# Patient Record
Sex: Female | Born: 1980 | Race: Black or African American | Hispanic: No | Smoking: Former smoker
Health system: Southern US, Community
[De-identification: ages and names within clinical notes are randomized; demographics above are authoritative.]

## PROBLEM LIST (undated history)

## (undated) DIAGNOSIS — R51 Headache: Secondary | ICD-10-CM

## (undated) DIAGNOSIS — A6000 Herpesviral infection of urogenital system, unspecified: Secondary | ICD-10-CM

## (undated) HISTORY — PX: BUNIONECTOMY: SHX129

---

## 2001-11-01 ENCOUNTER — Other Ambulatory Visit: Admission: RE | Admit: 2001-11-01 | Discharge: 2001-11-01 | Payer: Self-pay | Admitting: *Deleted

## 2002-12-16 ENCOUNTER — Inpatient Hospital Stay (HOSPITAL_COMMUNITY): Admission: AD | Admit: 2002-12-16 | Discharge: 2002-12-22 | Payer: Self-pay | Admitting: Obstetrics

## 2003-07-01 ENCOUNTER — Emergency Department (HOSPITAL_COMMUNITY): Admission: AD | Admit: 2003-07-01 | Discharge: 2003-07-01 | Payer: Self-pay | Admitting: Family Medicine

## 2003-07-01 ENCOUNTER — Ambulatory Visit (HOSPITAL_COMMUNITY): Admission: RE | Admit: 2003-07-01 | Discharge: 2003-07-01 | Payer: Self-pay | Admitting: Family Medicine

## 2004-02-14 ENCOUNTER — Ambulatory Visit (HOSPITAL_COMMUNITY): Admission: RE | Admit: 2004-02-14 | Discharge: 2004-02-14 | Payer: Self-pay | Admitting: Obstetrics

## 2004-09-21 ENCOUNTER — Emergency Department (HOSPITAL_COMMUNITY): Admission: EM | Admit: 2004-09-21 | Discharge: 2004-09-21 | Payer: Self-pay | Admitting: Family Medicine

## 2011-01-11 ENCOUNTER — Inpatient Hospital Stay (HOSPITAL_COMMUNITY): Payer: BC Managed Care – PPO

## 2011-01-11 ENCOUNTER — Inpatient Hospital Stay (HOSPITAL_COMMUNITY)
Admission: RE | Admit: 2011-01-11 | Discharge: 2011-01-11 | Disposition: A | Discharge status: Home / Self Care | Payer: BC Managed Care – PPO | Source: Ambulatory Visit | Attending: Obstetrics and Gynecology | Admitting: Obstetrics and Gynecology

## 2011-01-11 DIAGNOSIS — O368190 Decreased fetal movements, unspecified trimester, not applicable or unspecified: Secondary | ICD-10-CM | POA: Insufficient documentation

## 2011-01-11 LAB — URINALYSIS, ROUTINE W REFLEX MICROSCOPIC
Bilirubin Urine: NEGATIVE
Glucose, UA: NEGATIVE mg/dL
Hgb urine dipstick: NEGATIVE
Ketones, ur: 15 mg/dL — AB
Nitrite: NEGATIVE
Protein, ur: NEGATIVE mg/dL
Specific Gravity, Urine: 1.025 (ref 1.005–1.030)
Urobilinogen, UA: 0.2 mg/dL (ref 0.0–1.0)
pH: 6.5 (ref 5.0–8.0)

## 2011-03-04 ENCOUNTER — Encounter (HOSPITAL_COMMUNITY): Payer: Self-pay

## 2011-03-04 ENCOUNTER — Encounter (HOSPITAL_COMMUNITY)
Admission: RE | Admit: 2011-03-04 | Discharge: 2011-03-04 | Disposition: A | Discharge status: Home / Self Care | Payer: BC Managed Care – PPO | Source: Ambulatory Visit | Attending: Obstetrics and Gynecology | Admitting: Obstetrics and Gynecology

## 2011-03-04 HISTORY — DX: Headache: R51

## 2011-03-04 HISTORY — DX: Herpesviral infection of urogenital system, unspecified: A60.00

## 2011-03-04 LAB — SURGICAL PCR SCREEN
MRSA, PCR: NEGATIVE
Staphylococcus aureus: NEGATIVE

## 2011-03-04 LAB — CBC
HCT: 36.4 % (ref 36.0–46.0)
Hemoglobin: 12 g/dL (ref 12.0–15.0)
MCH: 29.4 pg (ref 26.0–34.0)
MCHC: 33 g/dL (ref 30.0–36.0)
MCV: 89.2 fL (ref 78.0–100.0)
Platelets: 162 10*3/uL (ref 150–400)
RBC: 4.08 MIL/uL (ref 3.87–5.11)
RDW: 15 % (ref 11.5–15.5)
WBC: 7.4 10*3/uL (ref 4.0–10.5)

## 2011-03-04 LAB — RPR: RPR Ser Ql: NONREACTIVE

## 2011-03-04 NOTE — Patient Instructions (Addendum)
18 Gabriela Price  03/04/2011   Your procedure is scheduled on:  03/10/11  Report to [facility] at 11 AM.  Call this number if you have problems the morning of surgery: [phone]   Remember:   Do not eat food:After Midnight.  Do not drink clear liquids: After Midnight.  Take these medicines the morning of surgery with A SIP OF WATER: NA   Do not wear jewelry, make-up or nail polish.  Do not bring valuables to the hospital.  Contacts, dentures or bridgework may not be worn into surgery.  Leave suitcase in the car. After surgery it may be brought to your room.  For patients admitted to the hospital, checkout time is 11:00 AM the day of discharge.   Patients discharged the day of surgery will not be allowed to drive home.  Name and phone number of your driver: NA  Special Instructions: Use CHG wash; 1/2 bottle PM before surgery and 1/2 bottle AM of surgery  Please read over the following fact sheets that you were given: CHG and MRSA +

## 2011-03-09 MED ORDER — CEFOTETAN DISODIUM 2 G IJ SOLR
2.0000 g | INTRAMUSCULAR | Status: AC
  Administered 2011-03-10: 2 g via INTRAVENOUS
  Filled 2011-03-09: qty 2

## 2011-03-10 ENCOUNTER — Encounter (HOSPITAL_COMMUNITY): Payer: Self-pay | Admitting: Anesthesiology

## 2011-03-10 ENCOUNTER — Encounter (HOSPITAL_COMMUNITY)
Admission: RE | Disposition: A | Discharge status: Home / Self Care | Payer: Self-pay | Source: Ambulatory Visit | Attending: Obstetrics and Gynecology

## 2011-03-10 ENCOUNTER — Encounter (HOSPITAL_COMMUNITY): Payer: Self-pay | Admitting: *Deleted

## 2011-03-10 ENCOUNTER — Inpatient Hospital Stay (HOSPITAL_COMMUNITY)
Admission: RE | Admit: 2011-03-10 | Discharge: 2011-03-13 | DRG: 370 | Disposition: A | Discharge status: Home / Self Care | Payer: BC Managed Care – PPO | Source: Ambulatory Visit | Attending: Obstetrics and Gynecology | Admitting: Obstetrics and Gynecology

## 2011-03-10 ENCOUNTER — Inpatient Hospital Stay (HOSPITAL_COMMUNITY): Payer: BC Managed Care – PPO | Admitting: Anesthesiology

## 2011-03-10 DIAGNOSIS — Z3490 Encounter for supervision of normal pregnancy, unspecified, unspecified trimester: Secondary | ICD-10-CM

## 2011-03-10 DIAGNOSIS — D649 Anemia, unspecified: Secondary | ICD-10-CM | POA: Diagnosis not present

## 2011-03-10 DIAGNOSIS — R519 Headache, unspecified: Secondary | ICD-10-CM

## 2011-03-10 DIAGNOSIS — Z01812 Encounter for preprocedural laboratory examination: Secondary | ICD-10-CM

## 2011-03-10 DIAGNOSIS — O34219 Maternal care for unspecified type scar from previous cesarean delivery: Principal | ICD-10-CM | POA: Diagnosis present

## 2011-03-10 DIAGNOSIS — B009 Herpesviral infection, unspecified: Secondary | ICD-10-CM

## 2011-03-10 DIAGNOSIS — O9903 Anemia complicating the puerperium: Secondary | ICD-10-CM | POA: Diagnosis not present

## 2011-03-10 DIAGNOSIS — Z01818 Encounter for other preprocedural examination: Secondary | ICD-10-CM

## 2011-03-10 SURGERY — Surgical Case
Anesthesia: Spinal | Site: Abdomen | Wound class: Clean Contaminated

## 2011-03-10 MED ORDER — OXYTOCIN 20 UNITS IN LACTATED RINGERS INFUSION - SIMPLE
INTRAVENOUS | Status: AC
  Filled 2011-03-10: qty 1000

## 2011-03-10 MED ORDER — PHENYLEPHRINE HCL 10 MG/ML IJ SOLN
INTRAMUSCULAR | Status: DC | PRN
  Administered 2011-03-10: 40 ug via INTRAVENOUS
  Administered 2011-03-10 (×5): 80 ug via INTRAVENOUS
  Administered 2011-03-10: 40 ug via INTRAVENOUS
  Administered 2011-03-10: 80 ug via INTRAVENOUS
  Administered 2011-03-10: 40 ug via INTRAVENOUS
  Administered 2011-03-10: 80 ug via INTRAVENOUS

## 2011-03-10 MED ORDER — SODIUM CHLORIDE 0.9 % IJ SOLN: 3.0000 mL | INTRAMUSCULAR | Status: DC | PRN

## 2011-03-10 MED ORDER — ONDANSETRON HCL 4 MG/2ML IJ SOLN
INTRAMUSCULAR | Status: AC
  Filled 2011-03-10: qty 2

## 2011-03-10 MED ORDER — KETOROLAC TROMETHAMINE 30 MG/ML IJ SOLN: 30.0000 mg | Freq: Four times a day (QID) | INTRAMUSCULAR | Status: AC | PRN

## 2011-03-10 MED ORDER — ZOLPIDEM TARTRATE 5 MG PO TABS: 5.0000 mg | ORAL_TABLET | Freq: Every evening | ORAL | Status: DC | PRN

## 2011-03-10 MED ORDER — METHYLERGONOVINE MALEATE 0.2 MG PO TABS: 0.2000 mg | ORAL_TABLET | ORAL | Status: DC | PRN

## 2011-03-10 MED ORDER — MENTHOL 3 MG MT LOZG: 1.0000 | LOZENGE | OROMUCOSAL | Status: DC | PRN

## 2011-03-10 MED ORDER — MEPERIDINE HCL 25 MG/ML IJ SOLN: 6.2500 mg | INTRAMUSCULAR | Status: DC | PRN

## 2011-03-10 MED ORDER — ONDANSETRON HCL 4 MG/2ML IJ SOLN: 4.0000 mg | INTRAMUSCULAR | Status: DC | PRN

## 2011-03-10 MED ORDER — PRENATAL PLUS 27-1 MG PO TABS: 1.0000 | ORAL_TABLET | Freq: Every day | ORAL | Status: DC

## 2011-03-10 MED ORDER — EPHEDRINE SULFATE 50 MG/ML IJ SOLN
INTRAMUSCULAR | Status: DC | PRN
  Administered 2011-03-10: 5 mg via INTRAVENOUS
  Administered 2011-03-10: 10 mg via INTRAVENOUS

## 2011-03-10 MED ORDER — PHENYLEPHRINE 40 MCG/ML (10ML) SYRINGE FOR IV PUSH (FOR BLOOD PRESSURE SUPPORT)
PREFILLED_SYRINGE | INTRAVENOUS | Status: AC
  Filled 2011-03-10: qty 15

## 2011-03-10 MED ORDER — OXYTOCIN 20 UNITS IN LACTATED RINGERS INFUSION - SIMPLE: INTRAVENOUS | Status: DC | PRN

## 2011-03-10 MED ORDER — LANOLIN HYDROUS EX OINT: 1.0000 "application " | TOPICAL_OINTMENT | CUTANEOUS | Status: DC | PRN

## 2011-03-10 MED ORDER — TETANUS-DIPHTH-ACELL PERTUSSIS 5-2.5-18.5 LF-MCG/0.5 IM SUSP: 0.5000 mL | Freq: Once | INTRAMUSCULAR | Status: DC

## 2011-03-10 MED ORDER — METOCLOPRAMIDE HCL 5 MG/ML IJ SOLN: 10.0000 mg | Freq: Once | INTRAMUSCULAR | Status: DC | PRN

## 2011-03-10 MED ORDER — NALOXONE HCL 0.4 MG/ML IJ SOLN: 0.4000 mg | INTRAMUSCULAR | Status: DC | PRN

## 2011-03-10 MED ORDER — FENTANYL CITRATE 0.05 MG/ML IJ SOLN
25.0000 ug | INTRAMUSCULAR | Status: DC | PRN
  Administered 2011-03-10 (×2): 50 ug via INTRAVENOUS

## 2011-03-10 MED ORDER — SCOPOLAMINE 1 MG/3DAYS TD PT72
1.0000 | MEDICATED_PATCH | Freq: Once | TRANSDERMAL | Status: DC
Start: 2011-03-10 — End: 2011-03-13
  Filled 2011-03-10: qty 1

## 2011-03-10 MED ORDER — PHENYLEPHRINE 40 MCG/ML (10ML) SYRINGE FOR IV PUSH (FOR BLOOD PRESSURE SUPPORT)
PREFILLED_SYRINGE | INTRAVENOUS | Status: AC
  Filled 2011-03-10: qty 5

## 2011-03-10 MED ORDER — OXYTOCIN 10 UNIT/ML IJ SOLN
INTRAMUSCULAR | Status: AC
  Filled 2011-03-10: qty 4

## 2011-03-10 MED ORDER — FENTANYL CITRATE 0.05 MG/ML IJ SOLN
INTRAMUSCULAR | Status: AC
  Filled 2011-03-10: qty 2

## 2011-03-10 MED ORDER — BUPIVACAINE IN DEXTROSE 0.75-8.25 % IT SOLN
INTRATHECAL | Status: DC | PRN
  Administered 2011-03-10: 11 mg via INTRATHECAL

## 2011-03-10 MED ORDER — KETOROLAC TROMETHAMINE 60 MG/2ML IM SOLN
60.0000 mg | Freq: Once | INTRAMUSCULAR | Status: AC | PRN
  Administered 2011-03-10: 60 mg via INTRAMUSCULAR

## 2011-03-10 MED ORDER — METHYLERGONOVINE MALEATE 0.2 MG/ML IJ SOLN: 0.2000 mg | INTRAMUSCULAR | Status: DC | PRN

## 2011-03-10 MED ORDER — MORPHINE SULFATE 0.5 MG/ML IJ SOLN
INTRAMUSCULAR | Status: AC
  Filled 2011-03-10: qty 10

## 2011-03-10 MED ORDER — OXYCODONE-ACETAMINOPHEN 5-325 MG PO TABS
1.0000 | ORAL_TABLET | ORAL | Status: DC | PRN
  Administered 2011-03-11 – 2011-03-12 (×3): 1 via ORAL
  Administered 2011-03-12: 2 via ORAL
  Administered 2011-03-12 – 2011-03-13 (×3): 1 via ORAL
  Filled 2011-03-10: qty 1
  Filled 2011-03-10: qty 2
  Filled 2011-03-10 (×5): qty 1

## 2011-03-10 MED ORDER — FERROUS SULFATE 325 (65 FE) MG PO TABS
325.0000 mg | ORAL_TABLET | Freq: Two times a day (BID) | ORAL | Status: DC
  Administered 2011-03-11 – 2011-03-13 (×5): 325 mg via ORAL
  Filled 2011-03-10 (×5): qty 1

## 2011-03-10 MED ORDER — IBUPROFEN 600 MG PO TABS
600.0000 mg | ORAL_TABLET | Freq: Four times a day (QID) | ORAL | Status: DC
  Administered 2011-03-10 – 2011-03-13 (×11): 600 mg via ORAL
  Filled 2011-03-10 (×2): qty 1

## 2011-03-10 MED ORDER — NALBUPHINE HCL 10 MG/ML IJ SOLN
5.0000 mg | INTRAMUSCULAR | Status: DC | PRN
  Filled 2011-03-10: qty 1

## 2011-03-10 MED ORDER — FENTANYL CITRATE 0.05 MG/ML IJ SOLN
INTRAMUSCULAR | Status: DC | PRN
  Administered 2011-03-10: 25 ug via INTRAVENOUS

## 2011-03-10 MED ORDER — WITCH HAZEL-GLYCERIN EX PADS: MEDICATED_PAD | CUTANEOUS | Status: DC | PRN

## 2011-03-10 MED ORDER — MORPHINE SULFATE (PF) 0.5 MG/ML IJ SOLN
INTRAMUSCULAR | Status: DC | PRN
  Administered 2011-03-10: .1 mg via EPIDURAL

## 2011-03-10 MED ORDER — NALOXONE HCL 0.4 MG/ML IJ SOLN
1.0000 ug/kg/h | INTRAMUSCULAR | Status: DC | PRN
  Filled 2011-03-10: qty 2.5

## 2011-03-10 MED ORDER — SIMETHICONE 80 MG PO CHEW: 80.0000 mg | CHEWABLE_TABLET | ORAL | Status: DC | PRN

## 2011-03-10 MED ORDER — EPHEDRINE 5 MG/ML INJ
INTRAVENOUS | Status: AC
  Filled 2011-03-10: qty 10

## 2011-03-10 MED ORDER — SENNOSIDES-DOCUSATE SODIUM 8.6-50 MG PO TABS
1.0000 | ORAL_TABLET | Freq: Every day | ORAL | Status: DC
  Administered 2011-03-10 – 2011-03-11 (×2): 2 via ORAL
  Administered 2011-03-12: 1 via ORAL

## 2011-03-10 MED ORDER — KETOROLAC TROMETHAMINE 60 MG/2ML IM SOLN
INTRAMUSCULAR | Status: AC
  Filled 2011-03-10: qty 2

## 2011-03-10 MED ORDER — ACETAMINOPHEN 325 MG PO TABS: 325.0000 mg | ORAL_TABLET | ORAL | Status: DC | PRN

## 2011-03-10 MED ORDER — MAGNESIUM HYDROXIDE 400 MG/5ML PO SUSP: 30.0000 mL | ORAL | Status: DC | PRN

## 2011-03-10 MED ORDER — PRENATAL PLUS 27-1 MG PO TABS
1.0000 | ORAL_TABLET | Freq: Every day | ORAL | Status: DC
  Administered 2011-03-11 – 2011-03-13 (×3): 1 via ORAL
  Filled 2011-03-10 (×3): qty 1

## 2011-03-10 MED ORDER — NALBUPHINE HCL 10 MG/ML IJ SOLN
5.0000 mg | INTRAMUSCULAR | Status: DC | PRN
  Administered 2011-03-11: 10 mg via SUBCUTANEOUS
  Filled 2011-03-10: qty 1

## 2011-03-10 MED ORDER — SODIUM CHLORIDE 0.9 % IR SOLN
Status: DC | PRN
  Administered 2011-03-10: 1000 mL
  Administered 2011-03-10: 2000 mL

## 2011-03-10 MED ORDER — DIPHENHYDRAMINE HCL 25 MG PO CAPS
25.0000 mg | ORAL_CAPSULE | Freq: Four times a day (QID) | ORAL | Status: DC | PRN
  Administered 2011-03-11: 25 mg via ORAL
  Filled 2011-03-10: qty 1

## 2011-03-10 MED ORDER — BUTALBITAL-APAP-CAFFEINE 50-325-40 MG PO TABS
1.0000 | ORAL_TABLET | ORAL | Status: DC | PRN
  Filled 2011-03-10: qty 1

## 2011-03-10 MED ORDER — OXYTOCIN 10 UNIT/ML IJ SOLN
20.0000 [IU] | INTRAVENOUS | Status: DC | PRN
  Administered 2011-03-10: 20 mL via INTRAVENOUS

## 2011-03-10 MED ORDER — SCOPOLAMINE 1 MG/3DAYS TD PT72
MEDICATED_PATCH | TRANSDERMAL | Status: AC
  Administered 2011-03-10: 1.5 mg
  Filled 2011-03-10: qty 1

## 2011-03-10 MED ORDER — SIMETHICONE 80 MG PO CHEW
80.0000 mg | CHEWABLE_TABLET | Freq: Three times a day (TID) | ORAL | Status: DC
  Administered 2011-03-10 – 2011-03-13 (×11): 80 mg via ORAL

## 2011-03-10 MED ORDER — ONDANSETRON HCL 4 MG/2ML IJ SOLN
INTRAMUSCULAR | Status: DC | PRN
  Administered 2011-03-10: 4 mg via INTRAVENOUS

## 2011-03-10 MED ORDER — VALACYCLOVIR HCL 500 MG PO TABS
500.0000 mg | ORAL_TABLET | Freq: Two times a day (BID) | ORAL | Status: DC
Start: 2011-03-10 — End: 2011-03-13
  Administered 2011-03-10 – 2011-03-13 (×6): 500 mg via ORAL
  Filled 2011-03-10 (×7): qty 1

## 2011-03-10 MED ORDER — IBUPROFEN 600 MG PO TABS
600.0000 mg | ORAL_TABLET | Freq: Four times a day (QID) | ORAL | Status: DC | PRN
  Filled 2011-03-10 (×9): qty 1

## 2011-03-10 MED ORDER — LACTATED RINGERS IV SOLN
INTRAVENOUS | Status: DC
  Administered 2011-03-10 (×4): via INTRAVENOUS

## 2011-03-10 MED ORDER — OXYTOCIN 20 UNITS IN LACTATED RINGERS INFUSION - SIMPLE
125.0000 mL/h | INTRAVENOUS | Status: AC
  Administered 2011-03-10: 125 mL/h via INTRAVENOUS

## 2011-03-10 MED ORDER — BUPIVACAINE HCL (PF) 0.25 % IJ SOLN
INTRAMUSCULAR | Status: DC | PRN
  Administered 2011-03-10: 20 mL

## 2011-03-10 SURGICAL SUPPLY — 47 items
APL SKNCLS STERI-STRIP NONHPOA (GAUZE/BANDAGES/DRESSINGS)
BENZOIN TINCTURE PRP APPL 2/3 (GAUZE/BANDAGES/DRESSINGS) IMPLANT
BLADE EXTENDED COATED 6.5IN (ELECTRODE) IMPLANT
BLADE HEX COATED 2.75 (ELECTRODE) ×2 IMPLANT
BOOTIES KNEE HIGH SLOAN (MISCELLANEOUS) ×4 IMPLANT
CHLORAPREP W/TINT 26ML (MISCELLANEOUS) ×2 IMPLANT
CLOTH BEACON ORANGE TIMEOUT ST (SAFETY) ×2 IMPLANT
CONTAINER PREFILL 10% NBF 15ML (MISCELLANEOUS) ×4 IMPLANT
DERMABOND ADVANCED (GAUZE/BANDAGES/DRESSINGS) IMPLANT
DRAIN JACKSON PRT FLT 7MM (DRAIN) IMPLANT
DRAPE UTILITY XL STRL (DRAPES) ×2 IMPLANT
DRESSING TELFA 8X3 (GAUZE/BANDAGES/DRESSINGS) IMPLANT
DRSG COVADERM 4X8 (GAUZE/BANDAGES/DRESSINGS) ×2 IMPLANT
ELECT REM PT RETURN 9FT ADLT (ELECTROSURGICAL) ×2
ELECTRODE REM PT RTRN 9FT ADLT (ELECTROSURGICAL) ×1 IMPLANT
EVACUATOR SILICONE 100CC (DRAIN) IMPLANT
EXTRACTOR VACUUM KIWI (MISCELLANEOUS) IMPLANT
EXTRACTOR VACUUM M CUP 4 TUBE (SUCTIONS) IMPLANT
GAUZE SPONGE 4X4 12PLY STRL LF (GAUZE/BANDAGES/DRESSINGS) ×2 IMPLANT
GLOVE ECLIPSE 6.0 STRL STRAW (GLOVE) ×2 IMPLANT
GLOVE INDICATOR 6.5 STRL GRN (GLOVE) ×2 IMPLANT
GLOVE SURG SS PI 6.5 STRL IVOR (GLOVE) ×4 IMPLANT
GLOVE SURG SS PI 7.0 STRL IVOR (GLOVE) ×2 IMPLANT
GOWN BRE IMP SLV AUR LG STRL (GOWN DISPOSABLE) ×4 IMPLANT
GOWN PREVENTION PLUS LG XLONG (DISPOSABLE) ×4 IMPLANT
KIT ABG SYR 3ML LUER SLIP (SYRINGE) IMPLANT
NEEDLE HYPO 25X5/8 SAFETYGLIDE (NEEDLE) ×2 IMPLANT
NEEDLE SPNL 22GX3.5 QUINCKE BK (NEEDLE) ×2 IMPLANT
NS IRRIG 1000ML POUR BTL (IV SOLUTION) ×4 IMPLANT
PACK C SECTION WH (CUSTOM PROCEDURE TRAY) ×2 IMPLANT
PAD ABD 7.5X8 STRL (GAUZE/BANDAGES/DRESSINGS) ×2 IMPLANT
SLEEVE SCD COMPRESS KNEE MED (MISCELLANEOUS) IMPLANT
STRIP CLOSURE SKIN 1/4X4 (GAUZE/BANDAGES/DRESSINGS) IMPLANT
SUT CHROMIC 2 0 SH (SUTURE) ×2 IMPLANT
SUT MNCRL AB 3-0 PS2 27 (SUTURE) ×2 IMPLANT
SUT SILK 0 FSL (SUTURE) IMPLANT
SUT VIC AB 0 CT1 27 (SUTURE) ×2
SUT VIC AB 0 CT1 27XBRD ANBCTR (SUTURE) ×2 IMPLANT
SUT VIC AB 0 CT1 36 (SUTURE) ×6 IMPLANT
SUT VIC AB 2-0 CT1 27 (SUTURE) ×4
SUT VIC AB 2-0 CT1 TAPERPNT 27 (SUTURE) ×2 IMPLANT
SUT VIC AB 2-0 SH 27 (SUTURE)
SUT VIC AB 2-0 SH 27XBRD (SUTURE) IMPLANT
SYR CONTROL 10ML LL (SYRINGE) ×2 IMPLANT
TOWEL OR 17X24 6PK STRL BLUE (TOWEL DISPOSABLE) ×4 IMPLANT
TRAY FOLEY CATH 14FR (SET/KITS/TRAYS/PACK) ×2 IMPLANT
WATER STERILE IRR 1000ML POUR (IV SOLUTION) ×2 IMPLANT

## 2011-03-10 NOTE — H&P (Signed)
Gabriela Price is a 30 y.o. MB female at 39.6 weeks per EDC of 03/11/2011, presenting for scheduled repeat cesarean section. Maternal Medical History:  Reason for admission: Scheduled repeat cesarean section  Contractions: Frequency: rare.   Perceived severity is mild.    Fetal activity: Perceived fetal activity is normal.   Last perceived fetal movement was within the past hour.    Prenatal complications: 1.  Previous c/s 2.  Migraines--rx'd Fioricet 3.  HSV II 4. Third trimester anemai    OB History    Grav Para Term Preterm Abortions TAB SAB Ect Mult Living   3 1   1 1    1      Past Medical History  Diagnosis Date  . Headache   . Herpes genitalia   --remote h/o abnl pap w/ nml colpo --h/o anemia  Past Surgical History  Procedure Date  . Bunionectomy     bilateral  . Cesarean section 2004   Family History: family history is not on file.  PGF: MI & CVA; Mom: HTN; M. Aunt: HTN; MGM & PGM DM; PGM & p. unlce with dementia; dad:  Illicit drug use. P. Uncle: MR.O Social History:  reports that she has quit smoking. She has never used smokeless tobacco. She reports that she does not drink alcohol or use illicit drugs.  Review of Systems  Constitutional: Negative.   HENT: Negative.        Last migraine 2 weeks ago  Respiratory: Negative.   Cardiovascular: Negative.   Gastrointestinal: Negative.        Last BM this AM  Genitourinary: Negative.       Blood pressure 108/78, pulse 101, temperature 98.6 F (37 C), temperature source Oral, resp. rate 18, SpO2 99.00%. Maternal Exam:  Abdomen: Patient reports no abdominal tenderness. Fetal presentation: vertex gravid  Introitus: not evaluated.     Fetal Exam Fetal Monitor Review: Mode: hand-held doppler probe.   See RN documentation for FHT's     Physical Exam  Constitutional: She is oriented to person, place, and time. She appears well-developed and well-nourished.  Cardiovascular: Normal rate and regular  rhythm.   Respiratory: Effort normal and breath sounds normal.  GI: Soft. Bowel sounds are normal.  Musculoskeletal: She exhibits edema.       Moderate generalized edema but nonpitting BLE; no clonus  Neurological: She is alert and oriented to person, place, and time. She has normal reflexes.  Skin: Skin is warm and dry.  Psychiatric: She has a normal mood and affect. Her behavior is normal. Thought content normal.    Prenatal labs: ABO, Rh:  O positive Antibody:  Neg  Rubella:  Immune RPR: NON REACTIVE (07/25 1101)  HBsAg:   Negative HIV:   nonreactive GBS:  neg  Hgb Electrophoresis: Nml 1hr gtt: nml Hgb =10.2 at time of 1hrgtt Assessment/Plan: 1.  IUP at 39.6 2.  Previous c/s with desire for repeat 3.  GBS negative 4.  H/o anemia in pregnancy 5.  Obese 6.  Chronic migraines 7.  H/o hsv II 8.  Patient desires Mirena PP and plans to BF.  1.  Admit to [facility] with Dr. Pennie Rushing as attending MD with routine preoperative and admitting orders 2.  Pt given option of TOLAC vs repeat c/s; R/B/A rev'd, including risks of but not limited to (with repeat c/s) infection, bleeding, damage to surrounding organs, & anesthesia complications.  After discussion, patient continues       To desire proceeding with repeat cesarean  section.   3.  All questions answered and MD to follow.   STEELMAN,CANDICE H 03/10/2011, 11:29 AM

## 2011-03-10 NOTE — Op Note (Signed)
Cesarean Section Procedure Note  Indications: Prior Cesarean section  Pre-operative Diagnosis: 39 week 6 day pregnancy.  Post-operative Diagnosis: same  Surgeon: Hal Morales  First Assistant:Candace Denny Levy  Surgeon: Hal Morales   Anesthesia: Spinal anesthesia  ASA Class: 2  Procedure Details  The patient was seen in the Holding Room. The risks, benefits, complications, treatment options, and expected outcomes were discussed with the patient.  The patient concurred with the proposed plan, giving informed consent.  The site of surgery properly noted/marked. The patient was taken to Operating Room # 9, identified as Gabriela Price and the procedure verified as C-Section Delivery. A Time Out was held and the above information confirmed.  After induction of anesthesia, the patient was  prepped with Chlora Prep in the usual sterile manner.A foley catheter was placed under sterile conditions.  The patient was then draped in the usual fashion.  After assurance of surgical anesthesia,Suprapubicsubcutaneous injection of 0.25% Bupivacaine   A Pfannenstiel incision was made and carried down through the subcutaneous tissue to the fascia. Fascial incision was made and extended transversely. The fascia was separated from the underlying rectus tissue superiorly and inferiorly. The peritoneum was identified and entered. Peritoneal incision was extended longitudinally.  The bladder blade was placed.   A low transverse uterine incision was made two cm above the uterovesical fold, and that incision extended transversely bluntly.The infant was  Delivered with the aid of a Kiwi Vacuum Extractor from OA presentation, and  was a 6lb 11oz femaile with Apgar scores of 6 at one minute and 9 at five minutes.The umbilical cord was clamped and cut.The placenta was removed intact and appeared normal, and was handed off to the employee of [employer] for donation. The uterine outline, tubes  and ovaries appeared normal for the gravid state, except for a 2cm pedunculated right fundal fibroid. The uterine incision was closed with running locked sutures of 0 Vicryl. An imbricating layer of sutures was placed. Hemostasis was observed. Lavage was carried out until clear. The peritoneum was closed with a running suture of 2-0 Vicryl.  The rectus muscles were reapproximated with a figure of 8 suture of 2-0 Vicryl.  The fascia was then reapproximated with a running sutures of 0 Vicryl .Renforcing figure of 8 sutures of 0Vicryl were placed on either side of midline.   The skin was reapproximated with}3-0 moncryl.  A sterile dressing was applied.  Instrument, sponge, and needle counts were correct prior to the abdominal closure and at the conclusion of the case.   Findings:  Placenta contained a 3 vessel cord which was eccentrically inserted    Estimated Blood Loss:          Drains: none             Specimens: placenta to L&D              Complications: ::"None; patient tolerated the procedure well."         Disposition: PACU - hemodynamically stable.         Condition: stable  Attending Attestation: I performed the procedure.  Marga Gramajo P  03/10/2011 2:30 PM

## 2011-03-10 NOTE — Transfer of Care (Signed)
Immediate Anesthesia Transfer of Care Note  Patient: Gabriela Price  Procedure(s) Performed:  CESAREAN SECTION - Repeat   Patient Location: PACU  Anesthesia Type: Spinal  Level of Consciousness: awake, alert  and oriented  Airway & Oxygen Therapy: Patient Spontanous Breathing  Post-op Assessment: Report given to PACU RN and Post -op Vital signs reviewed and stable  Post vital signs: Reviewed and stable  Complications: No apparent anesthesia complications

## 2011-03-10 NOTE — Anesthesia Procedure Notes (Signed)
Spinal Block  Patient location during procedure: OR Staffing Performed by: anesthesiologist  Preanesthetic Checklist Completed: patient identified, site marked, surgical consent, pre-op evaluation, timeout performed, IV checked, risks and benefits discussed and monitors and equipment checked Spinal Block Patient position: sitting Prep: DuraPrep Patient monitoring: heart rate, cardiac monitor, continuous pulse ox and blood pressure Approach: midline Location: L3-4 Injection technique: single-shot Needle Needle type: Sprotte  Needle gauge: 24 G Needle length: 9 cm Assessment Sensory level: T4 Additional Notes Patient identified.  Risk benefits discussed including failed block, incomplete pain control, headache, nerve damage, paralysis, blood pressure changes, nausea, vomiting, reactions to medication both toxic or allergic, and postpartum back pain.  Patient expressed understanding and wished to proceed.  All questions were answered.  Sterile technique used throughout procedure.  CSF was clear.  No parasthesia or other complications.  Please see nursing notes for vital signs.

## 2011-03-10 NOTE — Anesthesia Preprocedure Evaluation (Signed)
Anesthesia Evaluation  Name, MR# and DOB Patient awake  General Assessment Comment  Reviewed: Allergy & Precautions, H&P  and Patient's Chart, lab work & pertinent test results  Airway Mallampati: II TM Distance: >3 FB Neck ROM: full    Dental No notable dental hx    Pulmonary  clear to auscultation  pulmonary exam normal   Cardiovascular Exercise Tolerance: Good regular Normal   Neuro/Psych  GI/Hepatic/Renal   Endo/Other   Abdominal   Musculoskeletal  Hematology   Peds  Reproductive/Obstetrics   Anesthesia Other Findings             Anesthesia Physical Anesthesia Plan  ASA: II  Anesthesia Plan: Spinal   Post-op Pain Management:    Induction:   Airway Management Planned:   Additional Equipment:   Intra-op Plan:   Post-operative Plan:   Informed Consent: I have reviewed the patients History and Physical, chart, labs and discussed the procedure including the risks, benefits and alternatives for the proposed anesthesia with the patient or authorized representative who has indicated his/her understanding and acceptance.   Dental Advisory Given  Plan Discussed with: CRNA  Anesthesia Plan Comments: (Lab work confirmed with CRNA in room. Platelets okay. Discussed spinal anesthetic, and patient consents to the procedure:  included risk of possible headache,backache, failed block, allergic reaction, and nerve injury. This patient was asked if she had any questions or concerns before the procedure started. )        Anesthesia Quick Evaluation  

## 2011-03-10 NOTE — Progress Notes (Signed)
Mom's L side has a crack at base of nipple.  Mom's R side has some mild abrasion at tip of nipple.  Frenulum noted when baby cries (U-shaped tongue), but does not seem to affect tongue mobility.  Decreased suction/vacuum noted on oral exam, but does not seem to prevent baby from transferring milk effectively.  Lurline Hare [place]

## 2011-03-10 NOTE — Anesthesia Postprocedure Evaluation (Signed)
  Anesthesia Post-op Note  Patient: Gabriela Price  Procedure(s) Performed:  CESAREAN SECTION - Repeat  Patient's cardiopulmonary status is stable Patient's level of consciousness: sedate but responsive verbally Pain and nausea are all reasonably controlled No anesthetic complications apparent at this time No follow up care necessary at this time

## 2011-03-11 ENCOUNTER — Inpatient Hospital Stay (HOSPITAL_COMMUNITY)
Admission: RE | Admit: 2011-03-11 | Payer: BC Managed Care – PPO | Source: Ambulatory Visit | Admitting: Obstetrics and Gynecology

## 2011-03-11 ENCOUNTER — Encounter (HOSPITAL_COMMUNITY): Payer: Self-pay | Admitting: Obstetrics and Gynecology

## 2011-03-11 LAB — CBC
Hemoglobin: 9.1 g/dL — ABNORMAL LOW (ref 12.0–15.0)
MCH: 29.3 pg (ref 26.0–34.0)
MCHC: 33.2 g/dL (ref 30.0–36.0)
MCV: 88.1 fL (ref 78.0–100.0)

## 2011-03-11 NOTE — Progress Notes (Signed)
Subjective: Postpartum Day 1: Cesarean Delivery Patient reports tolerating PO, + flatus, + BM and no problems voiding.    Objective: Vital signs in last 24 hours: Temp:  [97.7 F (36.5 C)-99.2 F (37.3 C)] 98.6 F (37 C) (08/01 1022) Pulse Rate:  [75-93] 85  (08/01 1022) Resp:  [13-24] 20  (08/01 1022) BP: (64-119)/(35-86) 98/56 mmHg (08/01 1022) SpO2:  [97 %-100 %] 98 % (08/01 1022) Weight:  [90.266 kg (199 lb)] 199 lb (90.266 kg) (07/31 1711)  Physical Exam:  General: alert Lochia: appropriate Uterine Fundus: firm Incision: healing well, no significant drainage DVT Evaluation: No evidence of DVT seen on physical exam.   Basename 03/11/11 0550  HGB 9.1*  HCT 27.4*    Assessment/Plan: Status post Cesarean section. Doing well postoperatively.  Continue current care.  Rhyann Berton A 03/11/2011, 12:33 PM

## 2011-03-12 NOTE — Progress Notes (Addendum)
Subjective: Postpartum Day 2: Cesarean Delivery Patient reports no problems voiding.  Passing flatus, tolerating a regular diet.  Breastfeeding.  Objective: Vital signs in last 24 hours: Temp:  [97.9 F (36.6 C)-98.3 F (36.8 C)] 98.1 F (36.7 C) (08/02 0639) Pulse Rate:  [76-98] 76  (08/02 0639) Resp:  [18-20] 18  (08/02 0639) BP: (101)/(62-67) 101/67 mmHg (08/02 1610)  Physical Exam:  General: alert Lochia: appropriate Uterine Fundus: firm Incision: healing well DVT Evaluation: No evidence of DVT seen on physical exam.   Basename 03/11/11 0550  HGB 9.1*  HCT 27.4*    Assessment/Plan: Status post Cesarean section. Doing well postoperatively.  Anticipate discharge tomorrow.  LATHAM,VICKI L 03/12/2011, 11:50 AM   Agree with above. Purcell Nails, MD

## 2011-03-12 NOTE — Consult Note (Signed)
Reports BF better.  Appropriate infant output.  Pump paper work given.

## 2011-03-13 ENCOUNTER — Encounter (HOSPITAL_COMMUNITY)
Admission: RE | Admit: 2011-03-13 | Discharge: 2011-03-13 | Disposition: A | Discharge status: Home / Self Care | Payer: BC Managed Care – PPO | Source: Ambulatory Visit | Attending: Obstetrics and Gynecology | Admitting: Obstetrics and Gynecology

## 2011-03-13 DIAGNOSIS — O923 Agalactia: Secondary | ICD-10-CM | POA: Insufficient documentation

## 2011-03-13 MED ORDER — BUTALBITAL-APAP-CAFFEINE 50-325-40 MG PO TABS: 1.0000 | ORAL_TABLET | ORAL | Status: AC | PRN

## 2011-03-13 MED ORDER — OXYCODONE-ACETAMINOPHEN 5-325 MG PO TABS: 1.0000 | ORAL_TABLET | ORAL | Status: AC | PRN

## 2011-03-13 MED ORDER — IBUPROFEN 600 MG PO TABS: 600.0000 mg | ORAL_TABLET | Freq: Four times a day (QID) | ORAL | Status: AC | PRN

## 2011-03-13 MED ORDER — WITCH HAZEL-GLYCERIN EX PADS: MEDICATED_PAD | CUTANEOUS | Status: AC | PRN

## 2011-03-13 MED ORDER — LANOLIN HYDROUS EX OINT: 1.0000 "application " | TOPICAL_OINTMENT | CUTANEOUS | Status: DC | PRN

## 2011-03-13 NOTE — Discharge Summary (Signed)
Obstetric Discharge Summary Reason for Admission: cesarean section Prenatal Procedures: none Intrapartum Procedures: cesarean: low cervical, transverse Postpartum Procedures: none Complications-Operative and Postpartum: anemia  Temp:  [98.2 F (36.8 C)-98.4 F (36.9 C)] 98.4 F (36.9 C) (08/03 0604) Pulse Rate:  [88-98] 88  (08/03 0604) Resp:  [18] 18  (08/03 0604) BP: (105-107)/(70-71) 105/71 mmHg (08/03 0604) Hemoglobin  Date Value Range Status  03/11/2011 9.1* 12.0-15.0 (g/dL) Final     HCT  Date Value Range Status  03/11/2011 27.4* 36.0-46.0 (%) Final  Hospital Course:  The patient underwent a repeat low transverse cesarean section without difficulty.  Post operatively she had rapid return of bowel and bladder function.  She tolerated her post operative anemia without syncope or dizziness. By POD #3 she was eating a regular diet, getting adequate pain relief from her oral medication and was ready for discharge home.  Discharge Diagnoses: Term Pregnancy-delivered and prior c-section, s/p repeat. Chronic anemia  Discharge Information: Date: 03/13/2011 Activity: pelvic rest and Per [facility] discharge booklet Diet: routine Medications:  Medication List  As of 03/13/2011  3:25 PM   START taking these medications         butalbital-acetaminophen-caffeine 50-325-40 MG per tablet   Commonly known as: FIORICET, ESGIC   Take 1 tablet by mouth as needed for headache.      ibuprofen 600 MG tablet   Commonly known as: ADVIL,MOTRIN   Take 1 tablet (600 mg total) by mouth every 6 (six) hours as needed for pain.      lanolin Oint   Apply 1 application topically as needed (for breast care).      oxyCODONE-acetaminophen 5-325 MG per tablet   Commonly known as: PERCOCET   Take 1-2 tablets by mouth every 3 (three) hours as needed (moderate - severe pain).      witch hazel-glycerin pad   Commonly known as: TUCKS   Apply topically as needed (mild hemorrhoid pain).         CONTINUE taking  these medications         IRON PO      prenatal vitamin w/FE, FA 27-1 MG Tabs      valACYclovir 500 MG tablet   Commonly known as: VALTREX          Where to get your medications    These are the prescriptions that you need to pick up. We sent them to a specific pharmacy, so you will need to go there to get them.   [id number] - [place], [place] - [address] AT [address] OF [facility] [address]    [address] [place] [address]    Phone: [phone]        ibuprofen 600 MG tablet   lanolin Oint   witch hazel-glycerin pad         You may get these medications from any pharmacy.         butalbital-acetaminophen-caffeine 50-325-40 MG per tablet         Information on where to get these meds is not yet available. Ask your nurse or doctor.         oxyCODONE-acetaminophen 5-325 MG per tablet           Condition: stable and improved Instructions: refer to practice specific booklet Discharge to: home Follow-up Information    Follow up with [facility] P, MD. (pt has appt)    Contact information:   [address] [place] [address] [phone]  Newborn Data: Live born  Information for the patient's newborn:  Sharpnack, Girl Donyae [[id number]]  female ; APGAR 6,9 ; weight ;6lbs 11oz  Home with mother.  Elesia Pemberton P 03/13/2011, 3:25 PM

## 2011-03-13 NOTE — Progress Notes (Signed)
Subjective: Postpartum Day 3: Cesarean Delivery Patient reports tolerating PO, + flatus and no problems voiding.    Objective: Vital signs in last 24 hours: Temp:  [98.2 F (36.8 C)-98.4 F (36.9 C)] 98.4 F (36.9 C) (08/03 0604) Pulse Rate:  [88-98] 88  (08/03 0604) Resp:  [18] 18  (08/03 0604) BP: (105-107)/(70-71) 105/71 mmHg (08/03 0604)  Physical Exam:  General: alert, cooperative and no distress Lochia: appropriate Uterine Fundus: firm Incision: healing well, no significant drainage, no dehiscence, no significant erythema DVT Evaluation: No evidence of DVT seen on physical exam.   Basename 03/11/11 0550  HGB 9.1*  HCT 27.4*    Assessment/Plan: Status post Cesarean section. Doing well postoperatively.  Discharge home with standard precautions and return to clinic in 4-6 weeks.  Tayen Narang P 03/13/2011, 3:37 PM

## 2011-04-14 ENCOUNTER — Encounter (HOSPITAL_COMMUNITY)
Admission: RE | Admit: 2011-04-14 | Discharge: 2011-04-14 | Disposition: A | Discharge status: Home / Self Care | Payer: BC Managed Care – PPO | Source: Ambulatory Visit | Attending: Obstetrics and Gynecology | Admitting: Obstetrics and Gynecology

## 2011-04-14 DIAGNOSIS — O923 Agalactia: Secondary | ICD-10-CM | POA: Insufficient documentation

## 2011-05-14 ENCOUNTER — Encounter (HOSPITAL_COMMUNITY)
Admission: RE | Admit: 2011-05-14 | Discharge: 2011-05-14 | Disposition: A | Discharge status: Home / Self Care | Payer: BC Managed Care – PPO | Source: Ambulatory Visit | Attending: Obstetrics and Gynecology | Admitting: Obstetrics and Gynecology

## 2011-05-14 DIAGNOSIS — O923 Agalactia: Secondary | ICD-10-CM | POA: Insufficient documentation

## 2011-05-26 ENCOUNTER — Encounter (HOSPITAL_COMMUNITY): Payer: Self-pay | Admitting: *Deleted

## 2011-06-14 ENCOUNTER — Encounter (HOSPITAL_COMMUNITY)
Admission: RE | Admit: 2011-06-14 | Discharge: 2011-06-14 | Disposition: A | Discharge status: Home / Self Care | Payer: BC Managed Care – PPO | Source: Ambulatory Visit | Attending: Obstetrics and Gynecology | Admitting: Obstetrics and Gynecology

## 2011-06-14 DIAGNOSIS — O923 Agalactia: Secondary | ICD-10-CM | POA: Insufficient documentation

## 2011-07-15 ENCOUNTER — Encounter (HOSPITAL_COMMUNITY)
Admission: RE | Admit: 2011-07-15 | Discharge: 2011-07-15 | Disposition: A | Discharge status: Home / Self Care | Payer: BC Managed Care – PPO | Source: Ambulatory Visit | Attending: Obstetrics and Gynecology | Admitting: Obstetrics and Gynecology

## 2011-07-15 DIAGNOSIS — Z01419 Encounter for gynecological examination (general) (routine) without abnormal findings: Secondary | ICD-10-CM | POA: Insufficient documentation

## 2011-07-15 DIAGNOSIS — M542 Cervicalgia: Secondary | ICD-10-CM | POA: Insufficient documentation

## 2012-07-31 ENCOUNTER — Other Ambulatory Visit: Payer: Self-pay | Admitting: Obstetrics and Gynecology

## 2012-08-01 ENCOUNTER — Telehealth: Payer: Self-pay | Admitting: Obstetrics and Gynecology

## 2012-08-01 NOTE — Telephone Encounter (Signed)
Tc to pt. Pt aware rx for Valacyclovir e-pres to pharm on file.

## 2012-08-09 ENCOUNTER — Ambulatory Visit (INDEPENDENT_AMBULATORY_CARE_PROVIDER_SITE_OTHER): Payer: BC Managed Care – PPO | Admitting: Obstetrics and Gynecology

## 2012-08-09 ENCOUNTER — Encounter: Payer: Self-pay | Admitting: Obstetrics and Gynecology

## 2012-08-09 VITALS — BP 100/60 | Ht 62.0 in | Wt 173.0 lb

## 2012-08-09 DIAGNOSIS — Z124 Encounter for screening for malignant neoplasm of cervix: Secondary | ICD-10-CM

## 2012-08-09 DIAGNOSIS — B009 Herpesviral infection, unspecified: Secondary | ICD-10-CM

## 2012-08-09 MED ORDER — ACYCLOVIR 5 % EX OINT: TOPICAL_OINTMENT | CUTANEOUS | Status: AC

## 2012-08-09 MED ORDER — VALACYCLOVIR HCL 500 MG PO TABS: 500.0000 mg | ORAL_TABLET | Freq: Every day | ORAL | Status: DC

## 2012-08-09 NOTE — Progress Notes (Signed)
Subjective:    Gabriela Price is a 31 y.o. female, [id number], who presents for an annual exam.     History   Social History  . Marital Status: Married    Spouse Name: N/A    Number of Children: N/A  . Years of Education: N/A   Social History Main Topics  . Smoking status: Former Games developer  . Smokeless tobacco: Never Used  . Alcohol Use: No  . Drug Use: No  . Sexually Active: Yes     Comment: Husband Vasectomy    Other Topics Concern  . None   Social History Narrative  . None    Menstrual cycle:   LMP: Patient's last menstrual period was 07/25/2012.           Cycle: usually monthly for 5 days.  This month had 3 days spotting starting 2 days after the end of menses  The following portions of the patient's history were reviewed and updated as appropriate: allergies, current medications, past family history, past medical history, past social history, past surgical history and problem list.  Review of Systems Pertinent items are noted in HPI. Breast:Negative for breast lump,nipple discharge or nipple retraction Gastrointestinal: Negative for abdominal pain, change in bowel habits or rectal bleeding Urinary:negative   Objective:    BP 100/60  Ht 5\' 2"  (1.575 m)  Wt 173 lb (78.472 kg)  BMI 31.64 kg/m2  LMP 07/25/2012  Breastfeeding? No    Weight:  Wt Readings from Last 1 Encounters:  08/09/12 173 lb (78.472 kg)          BMI: Body mass index is 31.64 kg/(m^2).  General Appearance: Alert, appropriate appearance for age. No acute distress HEENT: Grossly normal Neck / Thyroid: Supple, no masses, nodes or enlargement Lungs: clear to auscultation bilaterally Back: No CVA tenderness Breast Exam: No masses or nodes.No dimpling, nipple retraction or discharge. Cardiovascular: Regular rate and rhythm. S1, S2, no murmur Gastrointestinal: Soft, non-tender, no masses or organomegaly Pelvic Exam: Vulva and vagina appear normal. Bimanual exam reveals normal uterus and  adnexa. Rectovaginal: normal rectal, no masses Lymphatic Exam: Non-palpable nodes in neck, clavicular, axillary, or inguinal regions Skin: no rash or abnormalities Neurologic: Normal gait and speech, no tremor  Psychiatric: Alert and oriented, appropriate affect.   Wet Prep:not applicable Urinalysis:not applicable UPT: Not done   Assessment:    Normal gyn exam Single episode post menstrual spotting    Plan:    pap smear with HR HPV. No hx abnl return annually or prn STD screening: declined Contraception:vasectomy   Dierdre Forth  MD  Last Pap: 04/20/2011 WNL: Yes Regular Periods:yes spotting the past few days  Contraception: Husband Vasectomy  Monthly Breast exam:no Tetanus<73yrs:yes Nl.Bladder Function:yes Daily BMs:yes Healthy Diet:yes Calcium:no Mammogram:no Date of Mammogram: n/a  Exercise:yes Have often Exercise: thre times weekly  Seatbelt: yes Abuse at home: no Stressful work:yes Sigmoid-colonoscopy: Pts mother had cancerous polyps  Bone Density: No PCP: n/a Change in PMH: 4 month old baby girl Change in AVW:UJWJ   Subjective:    Gabriela Price is a 31 y.o. female, [id number], who presents for an annual exam.     History   Social History  . Marital Status: Married    Spouse Name: N/A    Number of Children: N/A  . Years of Education: N/A   Social History Main Topics  . Smoking status: Former Games developer  . Smokeless tobacco: Never Used  . Alcohol Use: No  . Drug Use: No  .  Sexually Active: Yes     Comment: Husband Vasectomy    Other Topics Concern  . None   Social History Narrative  . None    Menstrual cycle:   LMP: Patient's last menstrual period was 07/25/2012.           Cycle: reg monthly, usually 5 days, no IM bleeding.  Cramps relieved by OTC meds  The following portions of the patient's history were reviewed and updated as appropriate: allergies, current medications, past family history, past medical history, past social history,  past surgical history and problem list.  Review of Systems Pertinent items are noted in HPI. Breast:Negative for breast lump,nipple discharge or nipple retraction Gastrointestinal: Negative for abdominal pain, change in bowel habits or rectal bleeding Urinary:negative   Objective:    BP 100/60  Ht 5\' 2"  (1.575 m)  Wt 173 lb (78.472 kg)  BMI 31.64 kg/m2  LMP 07/25/2012  Breastfeeding? No    Weight:  Wt Readings from Last 1 Encounters:  08/09/12 173 lb (78.472 kg)          BMI: Body mass index is 31.64 kg/(m^2).  General Appearance: Alert, appropriate appearance for age. No acute distress HEENT: Grossly normal Neck / Thyroid: Supple, no masses, nodes or enlargement Lungs: clear to auscultation bilaterally Back: No CVA tenderness Breast Exam: No masses or nodes.No dimpling, nipple retraction or discharge. Cardiovascular: Regular rate and rhythm. S1, S2, no murmur Gastrointestinal: Soft, non-tender, no masses or organomegaly Pelvic Exam: Vulva and vagina appear normal. Bimanual exam reveals normal uterus and adnexa. Rectovaginal: normal rectal, no masses Lymphatic Exam: Non-palpable nodes in neck, clavicular, axillary, or inguinal regions Skin: no rash or abnormalities Neurologic: Normal gait and speech, no tremor  Psychiatric: Alert and oriented, appropriate affect.   Wet Prep:not applicable Urinalysis:not applicable UPT: Not done   Assessment:    Normal gyn exam HSV II with occassional outbreaks    Plan:    pap smear with HR HPV return annually or prn STD screening: declined Contraception:vasectomy Valtrex and zovirax ointment  Delaine Lame

## 2012-08-11 LAB — PAP IG AND HPV HIGH-RISK: HPV DNA High Risk: NOT DETECTED

## 2012-09-29 ENCOUNTER — Other Ambulatory Visit: Payer: Self-pay | Admitting: Obstetrics and Gynecology

## 2013-08-10 DIAGNOSIS — F5089 Other specified eating disorder: Secondary | ICD-10-CM | POA: Insufficient documentation

## 2014-06-11 ENCOUNTER — Encounter: Payer: Self-pay | Admitting: Obstetrics and Gynecology

## 2014-08-10 HISTORY — PX: OTHER SURGICAL HISTORY: SHX169

## 2017-03-08 ENCOUNTER — Emergency Department (HOSPITAL_COMMUNITY): Payer: BC Managed Care – PPO

## 2017-03-08 ENCOUNTER — Emergency Department (HOSPITAL_COMMUNITY)
Admission: EM | Admit: 2017-03-08 | Discharge: 2017-03-08 | Disposition: A | Discharge status: Home / Self Care | Payer: BC Managed Care – PPO | Attending: Emergency Medicine | Admitting: Emergency Medicine

## 2017-03-08 DIAGNOSIS — S161XXA Strain of muscle, fascia and tendon at neck level, initial encounter: Secondary | ICD-10-CM | POA: Diagnosis not present

## 2017-03-08 DIAGNOSIS — Y92410 Unspecified street and highway as the place of occurrence of the external cause: Secondary | ICD-10-CM | POA: Insufficient documentation

## 2017-03-08 DIAGNOSIS — Y939 Activity, unspecified: Secondary | ICD-10-CM | POA: Insufficient documentation

## 2017-03-08 DIAGNOSIS — Y999 Unspecified external cause status: Secondary | ICD-10-CM | POA: Insufficient documentation

## 2017-03-08 DIAGNOSIS — R110 Nausea: Secondary | ICD-10-CM | POA: Diagnosis not present

## 2017-03-08 DIAGNOSIS — S1980XA Other specified injuries of unspecified part of neck, initial encounter: Secondary | ICD-10-CM | POA: Diagnosis present

## 2017-03-08 DIAGNOSIS — R51 Headache: Secondary | ICD-10-CM | POA: Insufficient documentation

## 2017-03-08 MED ORDER — CYCLOBENZAPRINE HCL 10 MG PO TABS
10.0000 mg | ORAL_TABLET | Freq: Once | ORAL | Status: AC
  Administered 2017-03-08: 10 mg via ORAL
  Filled 2017-03-08: qty 1

## 2017-03-08 MED ORDER — IBUPROFEN 400 MG PO TABS
600.0000 mg | ORAL_TABLET | Freq: Once | ORAL | Status: AC
  Administered 2017-03-08: 600 mg via ORAL
  Filled 2017-03-08: qty 1

## 2017-03-08 MED ORDER — CYCLOBENZAPRINE HCL 10 MG PO TABS: 10.0000 mg | ORAL_TABLET | Freq: Two times a day (BID) | ORAL | 0 refills | Status: DC | PRN

## 2017-03-08 MED ORDER — DICLOFENAC SODIUM 50 MG PO TBEC: 50.0000 mg | DELAYED_RELEASE_TABLET | Freq: Two times a day (BID) | ORAL | 0 refills | Status: DC
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
## 2017-03-08 NOTE — ED Notes (Signed)
Patient is A&Ox4.  No signs of distress noted.  Please see providers complete history and physical exam.  

## 2017-08-24 DIAGNOSIS — H9120 Sudden idiopathic hearing loss, unspecified ear: Secondary | ICD-10-CM | POA: Insufficient documentation

## 2017-08-24 DIAGNOSIS — H9311 Tinnitus, right ear: Secondary | ICD-10-CM | POA: Insufficient documentation

## 2017-08-27 ENCOUNTER — Other Ambulatory Visit: Payer: Self-pay | Admitting: Physician Assistant

## 2017-08-27 DIAGNOSIS — H9120 Sudden idiopathic hearing loss, unspecified ear: Secondary | ICD-10-CM

## 2017-08-27 DIAGNOSIS — H9311 Tinnitus, right ear: Secondary | ICD-10-CM

## 2017-09-01 ENCOUNTER — Ambulatory Visit
Admission: RE | Admit: 2017-09-01 | Discharge: 2017-09-01 | Disposition: A | Discharge status: Home / Self Care | Payer: BC Managed Care – PPO | Source: Ambulatory Visit | Attending: Physician Assistant | Admitting: Physician Assistant

## 2017-09-01 DIAGNOSIS — H9311 Tinnitus, right ear: Secondary | ICD-10-CM

## 2017-09-01 DIAGNOSIS — H9120 Sudden idiopathic hearing loss, unspecified ear: Secondary | ICD-10-CM

## 2017-09-01 MED ORDER — GADOBENATE DIMEGLUMINE 529 MG/ML IV SOLN
17.0000 mL | Freq: Once | INTRAVENOUS | Status: AC | PRN
  Administered 2017-09-01: 17 mL via INTRAVENOUS

## 2018-08-09 ENCOUNTER — Ambulatory Visit (HOSPITAL_COMMUNITY)
Admission: EM | Admit: 2018-08-09 | Discharge: 2018-08-09 | Disposition: A | Discharge status: Home / Self Care | Payer: BC Managed Care – PPO | Attending: Family Medicine | Admitting: Family Medicine

## 2018-08-09 ENCOUNTER — Encounter (HOSPITAL_COMMUNITY): Payer: Self-pay | Admitting: Emergency Medicine

## 2018-08-09 ENCOUNTER — Ambulatory Visit (INDEPENDENT_AMBULATORY_CARE_PROVIDER_SITE_OTHER): Payer: BC Managed Care – PPO

## 2018-08-09 DIAGNOSIS — M79672 Pain in left foot: Secondary | ICD-10-CM | POA: Insufficient documentation

## 2018-08-09 DIAGNOSIS — S9030XA Contusion of unspecified foot, initial encounter: Secondary | ICD-10-CM | POA: Insufficient documentation

## 2018-08-09 MED ORDER — NAPROXEN 500 MG PO TABS: 500.0000 mg | ORAL_TABLET | Freq: Two times a day (BID) | ORAL | 0 refills | Status: DC

## 2018-08-09 NOTE — ED Provider Notes (Signed)
[facility]   [id number] 08/09/18 Arrival Time: 1404  CC: Left foot pain  SUBJECTIVE: History from: patient. Merleen NicelyJawana S Amero is a 37 y.o. female complains of left foot pain that began earlier today.  Symptoms began after she dropped a 50-60lbs object on her foot.  Localizes the pain to the top of her foot.  Has tried OTC medications like motrin with minimal relief.  Symptoms are made worse with weight-bearing.  Denies similar symptoms in the past.  Complains of associated swelling, and ecchymosis.  Denies fever, chills, erythema, weakness, numbness and tingling.      ROS: As per HPI.  Past Medical History:  Diagnosis Date  . Headache(784.0)   . Herpes genitalia    Past Surgical History:  Procedure Laterality Date  . BUNIONECTOMY     bilateral  . CESAREAN SECTION  2004  . CESAREAN SECTION  03/10/2011   Procedure: CESAREAN SECTION;  Surgeon: Hal MoralesVanessa P Haygood, MD;  Location: [facility];  Service: Gynecology;  Laterality: N/A;  Repeat    Allergies  Allergen Reactions  . Hydrocodone Other (See Comments)    Triggers migraines- pt requests not to receive hydrocodone   No current facility-administered medications on file prior to encounter.    Current Outpatient Medications on File Prior to Encounter  Medication Sig Dispense Refill  . acyclovir ointment (ZOVIRAX) 5 % Pt to apply to affected area up to 5 times daily prn 30 g 12  . IRON PO Take 1 tablet by mouth daily.      Marland Kitchen. lanolin OINT Apply 1 application topically as needed (for breast care). 1 Bottle 1  . prenatal vitamin w/FE, FA (PRENATAL 1 + 1) 27-1 MG TABS Take 1 tablet by mouth daily.      . valACYclovir (VALTREX) 500 MG tablet TAKE 1 TABLET DAILY 30 tablet 0   Social History   Socioeconomic History  . Marital status: Married    Spouse name: Not on file  . Number of children: Not on file  . Years of education: Not on file  . Highest education level: Not on file  Occupational History  . Not on file  Social  Needs  . Financial resource strain: Not on file  . Food insecurity:    Worry: Not on file    Inability: Not on file  . Transportation needs:    Medical: Not on file    Non-medical: Not on file  Tobacco Use  . Smoking status: Former Games developermoker  . Smokeless tobacco: Never Used  Substance and Sexual Activity  . Alcohol use: No  . Drug use: No  . Sexual activity: Yes    Comment: Husband Vasectomy   Lifestyle  . Physical activity:    Days per week: Not on file    Minutes per session: Not on file  . Stress: Not on file  Relationships  . Social connections:    Talks on phone: Not on file    Gets together: Not on file    Attends religious service: Not on file    Active member of club or organization: Not on file    Attends meetings of clubs or organizations: Not on file    Relationship status: Not on file  . Intimate partner violence:    Fear of current or ex partner: Not on file    Emotionally abused: Not on file    Physically abused: Not on file    Forced sexual activity: Not on file  Other Topics Concern  .  Not on file  Social History Narrative  . Not on file   Family History  Problem Relation Age of Onset  . Cancer Mother        colon polyps    OBJECTIVE:  Vitals:   08/09/18 1516  BP: 117/71  Pulse: 84  Resp: 18  Temp: 98.9 F (37.2 C)  TempSrc: Oral  SpO2: 99%    General appearance: Alert; in no acute distress.  Head: NCAT Lungs: Normal respiratory effort CV: Posterior tibialis pulse intact left LE; cap refill <2 secs of the left LE Musculoskeletal: Left foot Inspection: Swelling and mild ecchymosis diffuse about the dorsal left foot Palpation: Diffusely TTP over the metatarsals ROM: wiggles toes without difficulty Strength: deferred due to discomfort Skin: warm and dry Neurologic: sitting in wheelchair; Sensation intact about the lower extremities Psychological: alert and cooperative; normal mood and affect  DIAGNOSTIC STUDIES:  Dg Foot Complete  Left  Result Date: 08/09/2018 CLINICAL DATA:  37 year old female with a history foot pain after injury EXAM: LEFT FOOT - COMPLETE 3+ VIEW COMPARISON:  None. FINDINGS: Surgical changes of prior first metatarsal surgery. No acute displaced fracture. Lateral view demonstrates soft tissue swelling on the dorsum of the forefoot. No subluxation/dislocation. No significant degenerative changes. No radiopaque foreign body. IMPRESSION: Negative for acute bony abnormality. Soft tissue swelling on the dorsum of the forefoot. Electronically Signed   By: Gilmer MorJaime  Wagner D.O.   On: 08/09/2018 15:48     ASSESSMENT & PLAN:  1. Left foot pain   2. Contusion of dorsum of foot      Meds ordered this encounter  Medications  . naproxen (NAPROSYN) 500 MG tablet    Sig: Take 1 tablet (500 mg total) by mouth 2 (two) times daily.    Dispense:  30 tablet    Refill:  0    Order Specific Question:   Supervising Provider    Answer:   Eustace MooreELSON, YVONNE SUE [[id number]]   X-rays negative for fracture or dislocation Boot placed Crutches given Continue conservative management of rest, ice, elevation Take naproxen as needed for pain relief (may cause abdominal discomfort, ulcers, and GI bleeds avoid taking with other NSAIDs) Follow up with orthopedist if symptoms persist Return or go to the ER if you have any new or worsening symptoms (fever, chills, chest pain, abdominal pain, changes in bowel or bladder habits, pain radiating into lower legs, etc...)   Reviewed expectations re: course of current medical issues. Questions answered. Outlined signs and symptoms indicating need for more acute intervention. Patient verbalized understanding. After Visit Summary given.    Rennis HardingWurst, Chanon Loney, PA-C 08/09/18 1747

## 2018-08-09 NOTE — ED Triage Notes (Signed)
Pt sts left foot pain after dropping base plate onto foot today

## 2018-08-09 NOTE — Discharge Instructions (Signed)
X-rays negative for fracture or dislocation Boot placed Crutches given Continue conservative management of rest, ice, elevation Take naproxen as needed for pain relief (may cause abdominal discomfort, ulcers, and GI bleeds avoid taking with other NSAIDs) Follow up with orthopedist if symptoms persist Return or go to the ER if you have any new or worsening symptoms (fever, chills, chest pain, abdominal pain, changes in bowel or bladder habits, pain radiating into lower legs, etc...)

## 2018-08-16 ENCOUNTER — Ambulatory Visit (INDEPENDENT_AMBULATORY_CARE_PROVIDER_SITE_OTHER): Payer: BC Managed Care – PPO

## 2018-08-16 ENCOUNTER — Encounter: Payer: Self-pay | Admitting: Podiatry

## 2018-08-16 ENCOUNTER — Ambulatory Visit: Payer: BC Managed Care – PPO | Admitting: Podiatry

## 2018-08-16 VITALS — BP 105/74 | HR 91 | Resp 16

## 2018-08-16 DIAGNOSIS — D649 Anemia, unspecified: Secondary | ICD-10-CM | POA: Insufficient documentation

## 2018-08-16 DIAGNOSIS — N852 Hypertrophy of uterus: Secondary | ICD-10-CM | POA: Insufficient documentation

## 2018-08-16 DIAGNOSIS — S99922A Unspecified injury of left foot, initial encounter: Secondary | ICD-10-CM

## 2018-08-16 DIAGNOSIS — N946 Dysmenorrhea, unspecified: Secondary | ICD-10-CM | POA: Insufficient documentation

## 2018-08-16 DIAGNOSIS — S92902A Unspecified fracture of left foot, initial encounter for closed fracture: Secondary | ICD-10-CM | POA: Diagnosis not present

## 2018-08-16 DIAGNOSIS — G43909 Migraine, unspecified, not intractable, without status migrainosus: Secondary | ICD-10-CM | POA: Insufficient documentation

## 2018-08-16 DIAGNOSIS — N920 Excessive and frequent menstruation with regular cycle: Secondary | ICD-10-CM | POA: Insufficient documentation

## 2018-08-16 DIAGNOSIS — R011 Cardiac murmur, unspecified: Secondary | ICD-10-CM | POA: Insufficient documentation

## 2018-08-16 MED ORDER — MELOXICAM 7.5 MG PO TABS: 7.5000 mg | ORAL_TABLET | Freq: Every day | ORAL | 0 refills | Status: DC

## 2018-08-16 NOTE — Progress Notes (Signed)
Subjective:    Patient ID: FUMIE DEVRIES, female    DOB: 08/24/1980, 38 y.o.   MRN: [id number]  HPI 38 year old female presents the office today for concerns of pain top of her left foot.  She states that on August 09, 2018 she dropped a 70 pound plant-based in the top of her foot.  She has had sudden increase in pain and swelling to the foot and she went to urgent care urgent x-rays performed which were negative.  She has been in the cam boot.  She states been hurting but yesterday it is starting to feel better.  She is also taking anti-inflammatories as needed.  She had to stop this because is causing stomach upset.  She has no other concerns.   Review of Systems  All other systems reviewed and are negative.  Past Medical History:  Diagnosis Date  . Headache(784.0)   . Herpes genitalia     Past Surgical History:  Procedure Laterality Date  . BUNIONECTOMY     bilateral  . CESAREAN SECTION  2004  . CESAREAN SECTION  03/10/2011   Procedure: CESAREAN SECTION;  Surgeon: Hal Morales, MD;  Location: [facility];  Service: Gynecology;  Laterality: N/A;  Repeat      Current Outpatient Medications:  .  acyclovir ointment (ZOVIRAX) 5 %, Pt to apply to affected area up to 5 times daily prn, Disp: 30 g, Rfl: 12 .  IRON PO, Take 1 tablet by mouth daily.  , Disp: , Rfl:  .  lanolin OINT, Apply 1 application topically as needed (for breast care)., Disp: 1 Bottle, Rfl: 1 .  meloxicam (MOBIC) 7.5 MG tablet, Take 1 tablet (7.5 mg total) by mouth daily., Disp: 30 tablet, Rfl: 0 .  naproxen (NAPROSYN) 500 MG tablet, Take 1 tablet (500 mg total) by mouth 2 (two) times daily., Disp: 30 tablet, Rfl: 0 .  prenatal vitamin w/FE, FA (PRENATAL 1 + 1) 27-1 MG TABS, Take 1 tablet by mouth daily.  , Disp: , Rfl:  .  valACYclovir (VALTREX) 500 MG tablet, TAKE 1 TABLET DAILY, Disp: 30 tablet, Rfl: 0  Allergies  Allergen Reactions  . Hydrocodone Other (See Comments)    Triggers migraines- pt requests  not to receive hydrocodone         Objective:   Physical Exam  General: AAO x3, NAD  Dermatological: Skin is warm, dry and supple bilateral. Nails x 10 are well manicured; remaining integument appears unremarkable at this time. There are no open sores, no preulcerative lesions, no rash or signs of infection present.  Vascular: Dorsalis Pedis artery and Posterior Tibial artery pedal pulses are 2/4 bilateral with immedate capillary fill time.  There is no pain with calf compression, swelling, warmth, erythema.   Neruologic: Grossly intact via light touch bilateral.  Musculoskeletal: There is still quite a bit of tenderness present along the dorsal aspect of the foot directly along the second, third, fourth metatarsals more on the basis.  There is moderate edema to the foot but there is no erythema or increase in warmth.  There is no open lesions.  Achilles tendon, flexor, extensor tendons appear to be intact however muscle strength appears to be 4/5 on the left foot.   Gait: Wearing cam boot, Nonantalgic.      Assessment & Plan:  38 year old female with left foot injury. -Treatment options discussed including all alternatives, risks, and complications -Etiology of symptoms were discussed -Repeat x-rays were obtained and reviewed which not reveal  any evidence of acute fracture or stress fracture.  However given the pinpoint tenderness she is having as well the continued swelling as well as dropping a 70 pound weight on her foot will order CT scan of the foot.  This was ordered today.  Continue ice elevate and continue the cam boot.  She can do anti-inflammatories as needed but it is upsetting her stomach would hold off on this.  We will see if she tolerates meloxicam better once a day.  This was prescribed today.  Vivi Barrack DPM

## 2018-08-16 NOTE — Patient Instructions (Signed)
I have ordered a CT scan of your left foot. If you do not hear from our office or [facility] within 1 week about scheduling please give [place] a call.   Continue to wear the surgical boot.   Ice and elevate

## 2018-08-17 ENCOUNTER — Telehealth: Payer: Self-pay | Admitting: *Deleted

## 2018-08-17 DIAGNOSIS — S99922A Unspecified injury of left foot, initial encounter: Secondary | ICD-10-CM

## 2018-08-17 NOTE — Telephone Encounter (Signed)
Orders to J. Quintana, RN for pre-cert and faxed to [facility]. 

## 2018-08-17 NOTE — Telephone Encounter (Signed)
-----   Message from Vivi BarrackMatthew R Wagoner, DPM sent at 08/16/2018 10:49 AM EST ----- Can you please order a CT scan of the left foot to rule out fracture. 70 lb plate fell on foot and still has a lot of pinpoint pain on the metatarsals and swelling. 2 sets of x-ray are negative for fracture. Thanks.   (this is Ammie's daughter)

## 2018-09-05 ENCOUNTER — Ambulatory Visit: Payer: BC Managed Care – PPO | Admitting: Podiatry

## 2018-09-10 ENCOUNTER — Other Ambulatory Visit: Payer: Self-pay | Admitting: Podiatry

## 2018-10-08 ENCOUNTER — Other Ambulatory Visit: Payer: Self-pay | Admitting: Podiatry

## 2019-04-21 IMAGING — DX DG FOOT COMPLETE 3+V*L*
3 series · 3 of 3 positions shown · non-contrast
Comparison: None.

CLINICAL DATA: 37-year-old female with a history foot pain after
injury

EXAM:
LEFT FOOT - COMPLETE 3+ VIEW

[foot ap]
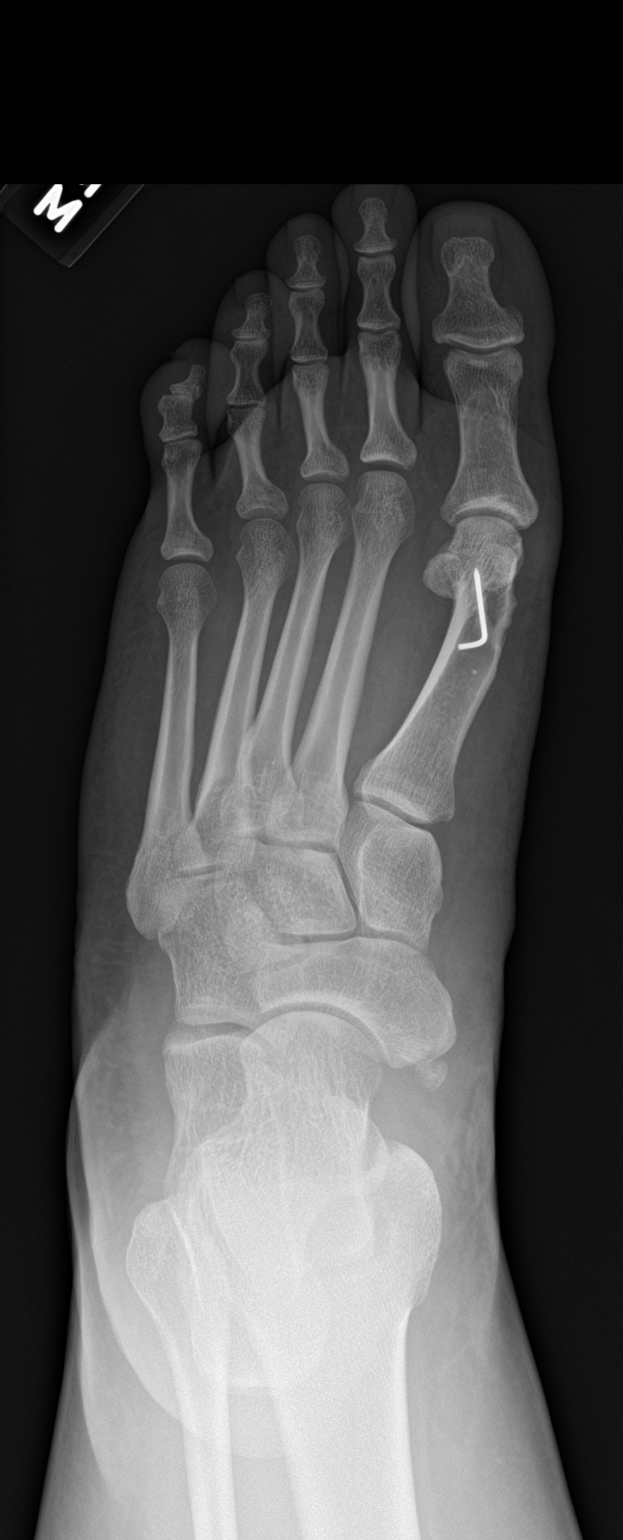

[foot obl]
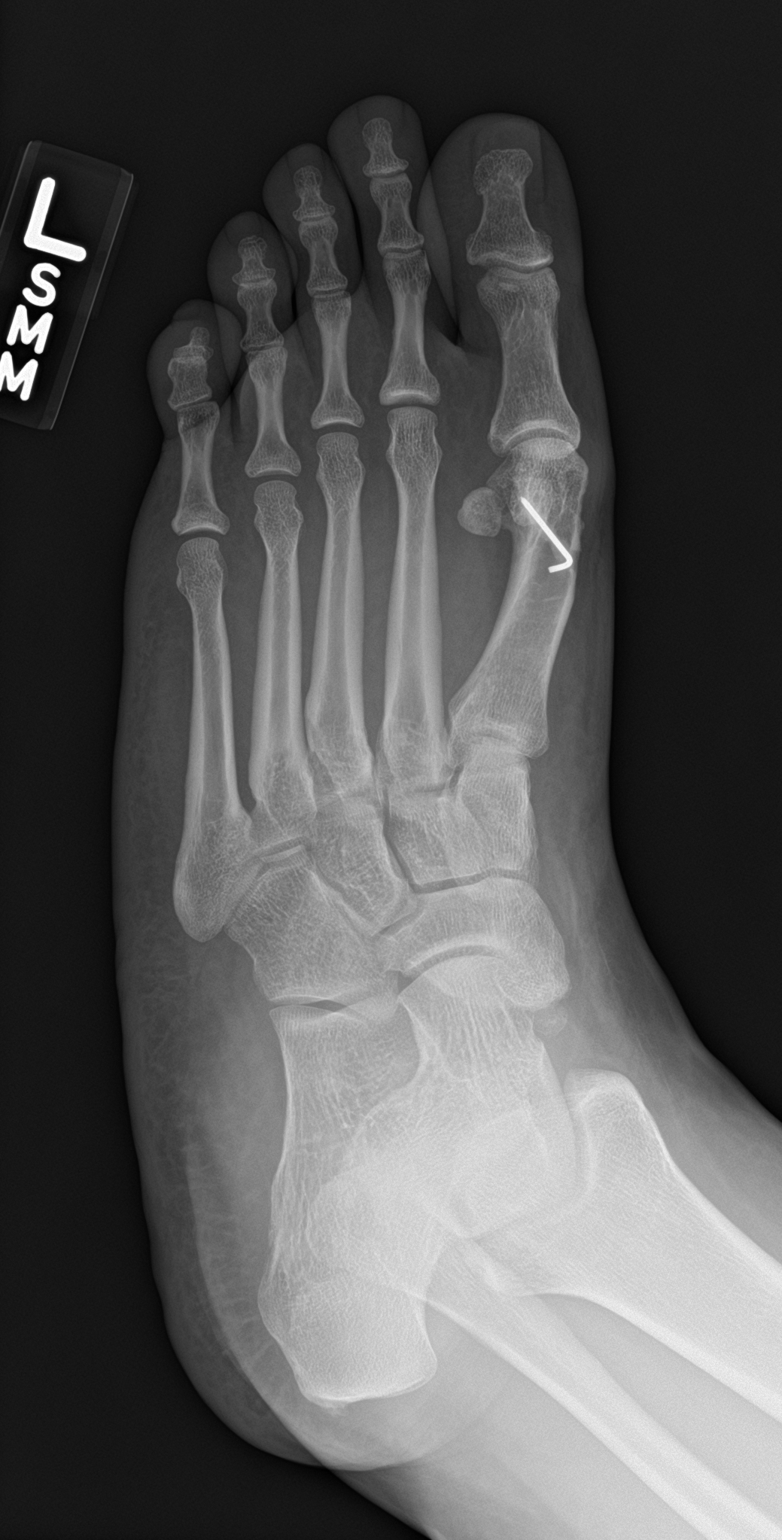

[foot lat]
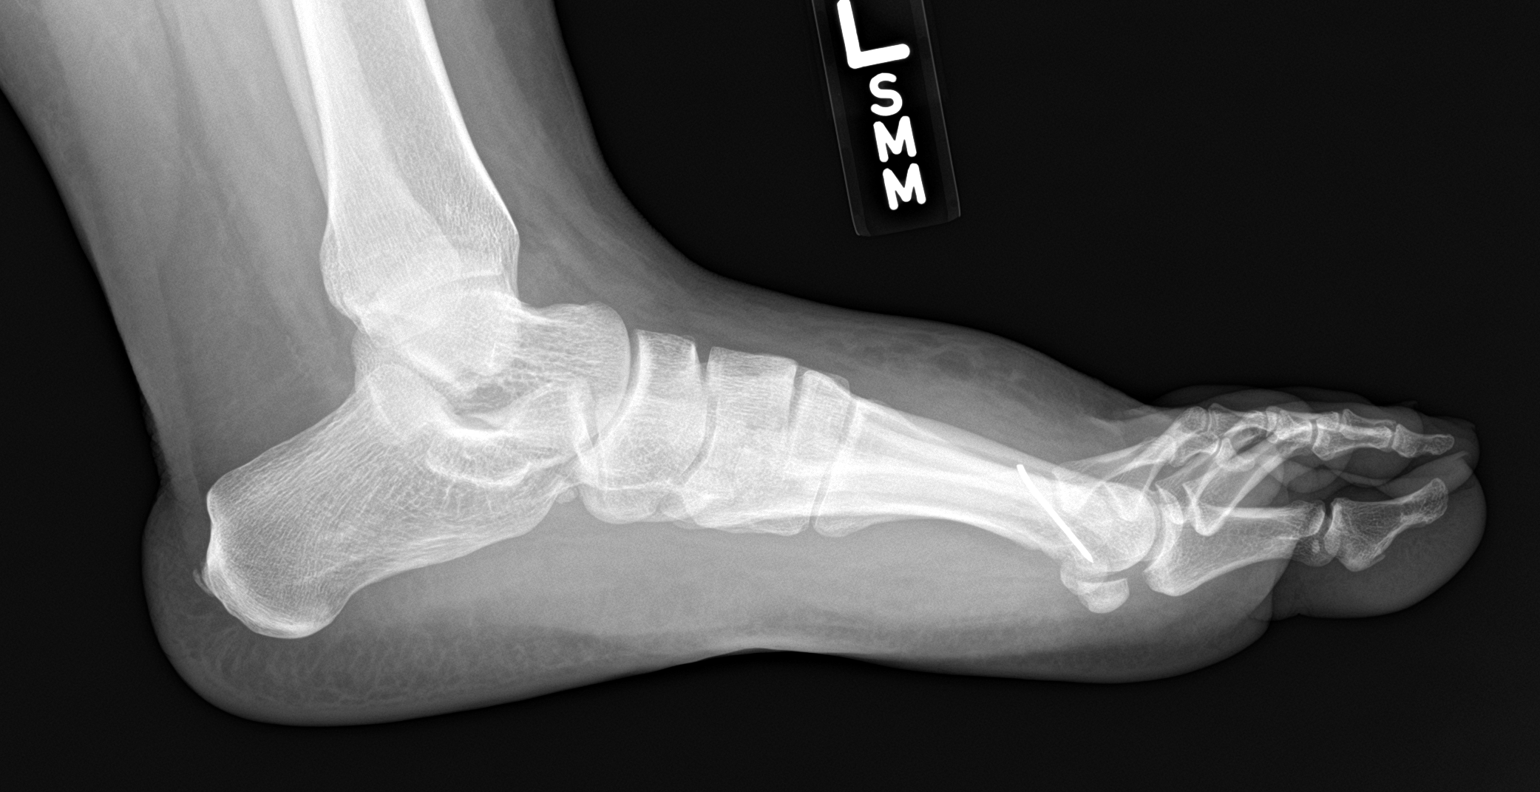

[3 of 3 positions shown; findings below may reference images not displayed]

FINDINGS: Surgical changes of prior first metatarsal surgery.

No acute displaced fracture. Lateral view demonstrates soft tissue
swelling on the dorsum of the forefoot. No subluxation/dislocation.
No significant degenerative changes. No radiopaque foreign body.
IMPRESSION: Negative for acute bony abnormality.

Soft tissue swelling on the dorsum of the forefoot.

## 2019-06-19 ENCOUNTER — Other Ambulatory Visit: Payer: Self-pay

## 2019-06-19 DIAGNOSIS — Z20822 Contact with and (suspected) exposure to covid-19: Secondary | ICD-10-CM

## 2019-06-20 LAB — NOVEL CORONAVIRUS, NAA: SARS-CoV-2, NAA: NOT DETECTED

## 2020-01-11 ENCOUNTER — Ambulatory Visit: Payer: Self-pay | Attending: Family

## 2020-01-11 DIAGNOSIS — Z23 Encounter for immunization: Secondary | ICD-10-CM

## 2020-01-31 NOTE — Progress Notes (Signed)
° °  Covid-19 Vaccination Clinic  Name:  Gabriela Price    MRN: [id number] DOB: Oct 09, 1980  01/31/2020  [name] was observed post Covid-19 immunization for 15 minutes without incident. She was provided with Vaccine Information Sheet and instruction to access the V-Safe system.   [name] was instructed to call 911 with any severe reactions post vaccine:  Difficulty breathing   Swelling of face and throat   A fast heartbeat   A bad rash all over body   Dizziness and weakness   Immunizations Administered    Name Date Dose VIS Date Route   Pfizer COVID-19 Vaccine 01/11/2020  3:00 PM 0.3 mL 10/04/2018 Intramuscular   Manufacturer: [employer], [employer]   Lot: [id number]   NDC: [address]-2

## 2020-02-06 ENCOUNTER — Ambulatory Visit: Payer: Self-pay | Attending: Family

## 2020-02-06 DIAGNOSIS — Z23 Encounter for immunization: Secondary | ICD-10-CM

## 2020-02-06 NOTE — Progress Notes (Signed)
   Covid-19 Vaccination Clinic  Name:  DENECE SHEARER    MRN: [id number] DOB: 1981/03/27  02/06/2020  Ms. Bayus was observed post Covid-19 immunization for 15 minutes without incident. She was provided with Vaccine Information Sheet and instruction to access the V-Safe system.   Ms. Giacobbe was instructed to call 911 with any severe reactions post vaccine: Marland Kitchen Difficulty breathing  . Swelling of face and throat  . A fast heartbeat  . A bad rash all over body  . Dizziness and weakness   Immunizations Administered    Name Date Dose VIS Date Route   Pfizer COVID-19 Vaccine 02/06/2020  1:29 PM 0.3 mL 10/04/2018 Intramuscular   Manufacturer: [employer], [employer]   Lot: [id number]   NDC: [address]-5

## 2022-04-27 ENCOUNTER — Ambulatory Visit: Payer: BC Managed Care – PPO | Admitting: Family

## 2022-04-28 ENCOUNTER — Encounter: Payer: Self-pay | Admitting: Family Medicine

## 2022-04-28 ENCOUNTER — Ambulatory Visit: Payer: BC Managed Care – PPO | Admitting: Internal Medicine

## 2022-04-28 ENCOUNTER — Ambulatory Visit (INDEPENDENT_AMBULATORY_CARE_PROVIDER_SITE_OTHER): Payer: BC Managed Care – PPO | Admitting: Family Medicine

## 2022-04-28 VITALS — BP 102/70 | HR 81 | Temp 98.0°F | Ht 63.0 in | Wt 200.4 lb

## 2022-04-28 DIAGNOSIS — Z114 Encounter for screening for human immunodeficiency virus [HIV]: Secondary | ICD-10-CM

## 2022-04-28 DIAGNOSIS — Z1159 Encounter for screening for other viral diseases: Secondary | ICD-10-CM

## 2022-04-28 DIAGNOSIS — Z0000 Encounter for general adult medical examination without abnormal findings: Secondary | ICD-10-CM | POA: Diagnosis not present

## 2022-04-28 LAB — LIPID PANEL
Cholesterol: 223 mg/dL — ABNORMAL HIGH (ref 0–200)
HDL: 67.8 mg/dL (ref >39.00)
LDL Cholesterol: 142 mg/dL — ABNORMAL HIGH (ref 0–99)
NonHDL: 154.9
Total CHOL/HDL Ratio: 3
Triglycerides: 66 mg/dL (ref 0.0–149.0)
VLDL: 13.2 mg/dL (ref 0.0–40.0)

## 2022-04-28 LAB — COMPREHENSIVE METABOLIC PANEL
ALT: 9 U/L (ref 0–35)
AST: 13 U/L (ref 0–37)
Albumin: 4.2 g/dL (ref 3.5–5.2)
Alkaline Phosphatase: 55 U/L (ref 39–117)
BUN: 11 mg/dL (ref 6–23)
CO2: 29 mEq/L (ref 19–32)
Calcium: 9.5 mg/dL (ref 8.4–10.5)
Chloride: 102 mEq/L (ref 96–112)
Creatinine, Ser: 0.85 mg/dL (ref 0.40–1.20)
GFR: 84.04 mL/min (ref >60.00)
Glucose, Bld: 81 mg/dL (ref 70–99)
Potassium: 4 mEq/L (ref 3.5–5.1)
Sodium: 137 mEq/L (ref 135–145)
Total Bilirubin: 0.4 mg/dL (ref 0.2–1.2)
Total Protein: 7.4 g/dL (ref 6.0–8.3)

## 2022-04-28 LAB — CBC WITH DIFFERENTIAL/PLATELET
Basophils Absolute: 0.1 10*3/uL (ref 0.0–0.1)
Basophils Relative: 1.2 % (ref 0.0–3.0)
Eosinophils Absolute: 0.1 10*3/uL (ref 0.0–0.7)
Eosinophils Relative: 2 % (ref 0.0–5.0)
HCT: 38.2 % (ref 36.0–46.0)
Hemoglobin: 12.6 g/dL (ref 12.0–15.0)
Lymphocytes Relative: 34.8 % (ref 12.0–46.0)
Lymphs Abs: 1.6 10*3/uL (ref 0.7–4.0)
MCHC: 33 g/dL (ref 30.0–36.0)
MCV: 88.9 fl (ref 78.0–100.0)
Monocytes Absolute: 0.3 10*3/uL (ref 0.1–1.0)
Monocytes Relative: 5.6 % (ref 3.0–12.0)
Neutro Abs: 2.6 10*3/uL (ref 1.4–7.7)
Neutrophils Relative %: 56.4 % (ref 43.0–77.0)
Platelets: 266 10*3/uL (ref 150.0–400.0)
RBC: 4.3 Mil/uL (ref 3.87–5.11)
RDW: 13.3 % (ref 11.5–15.5)
WBC: 4.5 10*3/uL (ref 4.0–10.5)

## 2022-04-28 LAB — HEMOGLOBIN A1C: Hgb A1c MFr Bld: 5.5 % (ref 4.6–6.5)

## 2022-04-28 LAB — TSH: TSH: 1.2 u[IU]/mL (ref 0.35–5.50)

## 2022-04-28 NOTE — Patient Instructions (Signed)
Welcome to [facility] [place]! It was a pleasure meeting you today. ° °As discussed, Please schedule a 12 month follow up visit today. ° °PLEASE NOTE: ° °If you had any LAB tests please let us know if you have not heard back within a few days. You may see your results on MyChart before we have a chance to review them but we will give you a call once they are reviewed by us. If we ordered any REFERRALS today, please let us know if you have not heard from their office within the next week.  °Let us know through MyChart if you are needing REFILLS, or have your pharmacy send us the request. You can also use MyChart to communicate with me or any office staff. ° °Please try these tips to maintain a healthy lifestyle: ° °Eat most of your calories during the day when you are active. Eliminate processed foods including packaged sweets (pies, cakes, cookies), reduce intake of potatoes, white bread, white pasta, and white rice. Look for whole grain options, oat flour or almond flour. ° °Each meal should contain half fruits/vegetables, one quarter protein, and one quarter carbs (no bigger than a computer mouse). ° °Cut down on sweet beverages. This includes juice, soda, and sweet tea. Also watch fruit intake, though this is a healthier sweet option, it still contains natural sugar! Limit to 3 servings daily. ° °Drink at least 1 glass of water with each meal and aim for at least 8 glasses per day ° °Exercise at least 150 minutes every week.   °

## 2022-04-28 NOTE — Progress Notes (Signed)
New Patient Office Visit  Subjective:  Patient ID: Gabriela Price, female    DOB: [birth year]  Age: 41 y.o. MRN: [id number]  CC:  Chief Complaint  Patient presents with   Establish Care    Need new pcp No food, just coffee Pap scheduled for May 12, 2022   Migraine    Started a while ago, but came back, went to walk-in given imitex and has neuro appt. February 2024    HPI Gabriela Price presents for new pt.  Migraine  Migraine-has neuro appt at [facility] on 10/05/22.  Migraine for long time-2005.  Was seeing neuro.  Had imaging as daily.  Was getting daily in past-meds.  Then doing ok and getting monthly.-excedrin migraine worked.   But for past month-daily.  Went to walk in clinic and got imitrex- and took for 5 days and resolved. No Aura.  +photo/phono.  +nausea.  No vomiting.  Annual cpx-exercises.  Working on diet.     Past Medical History:  Diagnosis Date   Headache(784.0)    Herpes genitalia     Past Surgical History:  Procedure Laterality Date   ablation  2016   uterus   BUNIONECTOMY     bilateral   CESAREAN SECTION  08/10/2002   CESAREAN SECTION  03/10/2011   Procedure: CESAREAN SECTION;  Surgeon: Hal Morales, MD;  Location: [facility];  Service: Gynecology;  Laterality: N/A;  Repeat     Family History  Problem Relation Age of Onset   Cancer Mother        colon polyps-no cancer   Diabetes Maternal Grandmother    Diabetes Maternal Grandfather    Diabetes Paternal Grandmother    Diabetes Paternal Grandfather     Social History   Socioeconomic History   Marital status: Divorced    Spouse name: Not on file   Number of children: 2   Years of education: Not on file   Highest education level: Not on file  Occupational History   Not on file  Tobacco Use   Smoking status: Former   Smokeless tobacco: Never  Building services engineer Use: Former   Substances: CBD  Substance and Sexual Activity   Alcohol use: No   Drug use: No   Sexual activity: Yes     Comment: Husband Vasectomy   Other Topics Concern   Not on file  Social History Narrative   Building services engineer, teaches english   Social Determinants of Health   Financial Resource Strain: Not on file  Food Insecurity: Not on file  Transportation Needs: Not on file  Physical Activity: Not on file  Stress: Not on file  Social Connections: Not on file  Intimate Partner Violence: Not on file    ROS  ROS: Gen: no fever, chills  Skin: no rash, itching ENT: no ear pain, ear drainage, nasal congestion, rhinorrhea, sinus pressure, sore throat Eyes: no blurry vision, double vision Resp: no cough, wheeze,SOB CV: no CP, palpitations, LE .  Occ edema ankles(long time).  GI: no heartburn, n/v/d/c, abd pain.  GU: no dysuria, urgency, frequency, hematuria.  No menses since ablation.  Gyn 10/3 and mamm.  LOV:FIEP and shoulder.  Neuro: no dizziness,  weakness, vertigo Psych: no depression, anxiety, insomnia, SI .    Seeing therapist.   Objective:   Today's Vitals: BP 102/70   Pulse 81   Temp 98 F (36.7 C) (Temporal)   Ht 5\' 3"  (1.6 m)   Wt 200 lb 6 oz (  90.9 kg)   SpO2 97%   BMI 35.49 kg/m   Physical Exam  Gen: WDWN NAD AAF HEENT: NCAT, conjunctiva not injected, sclera nonicteric TM WNL B, OP moist, no exudates  NECK:  supple, no thyromegaly, no nodes, no carotid bruits CARDIAC: RRR, S1S2+, no murmur. DP 2+B LUNGS: CTAB. No wheezes ABDOMEN:  BS+, soft, NTND, No HSM, no masses EXT:  no edema MSK: no gross abnormalities.  NEURO: A&O x3.  CN II-XII intact.  PSYCH: normal mood. Good eye contact   Assessment & Plan:   Problem List Items Addressed This Visit   None Visit Diagnoses     Wellness examination    -  Primary   Relevant Orders   Comprehensive metabolic panel   Hemoglobin A1c   Hepatitis C antibody   HIV Antibody (routine testing w rflx)   Lipid panel   TSH   CBC with Differential/Platelet   Encounter for hepatitis C screening test for low risk patient       Relevant  Orders   Hepatitis C antibody   Screening for HIV without presence of risk factors       Relevant Orders   HIV Antibody (routine testing w rflx)      Wellness-anticipatory guidance.  Work on [employer].  Check CBC,CMP,lipids,TSH, A1C.  F/u 1 yr   Outpatient Encounter Medications as of 04/28/2022  Medication Sig   acyclovir ointment (ZOVIRAX) 5 % Pt to apply to affected area up to 5 times daily prn   SUMAtriptan (IMITREX) 100 MG tablet Take by mouth.   valACYclovir (VALTREX) 500 MG tablet TAKE 1 TABLET DAILY   [DISCONTINUED] IRON PO Take 1 tablet by mouth daily.     [DISCONTINUED] lanolin OINT Apply 1 application topically as needed (for breast care).   [DISCONTINUED] meloxicam (MOBIC) 7.5 MG tablet TAKE 1 TABLET BY MOUTH EVERY DAY   [DISCONTINUED] naproxen (NAPROSYN) 500 MG tablet Take 1 tablet (500 mg total) by mouth 2 (two) times daily.   [DISCONTINUED] prenatal vitamin w/FE, FA (PRENATAL 1 + 1) 27-1 MG TABS Take 1 tablet by mouth daily.     No facility-administered encounter medications on file as of 04/28/2022.    Follow-up: Return in about 1 year (around 04/29/2023) for annual.   Wellington Hampshire, MD

## 2022-04-29 LAB — HEPATITIS C ANTIBODY: Hepatitis C Ab: NONREACTIVE

## 2022-04-29 LAB — HIV ANTIBODY (ROUTINE TESTING W REFLEX): HIV 1&2 Ab, 4th Generation: NONREACTIVE

## 2022-05-02 NOTE — Progress Notes (Signed)
Labs normal except: Your cholesterol levels are elevated.  Work on low cholesterol and lower carbs/sugars diet and  get exercise to try to lower your cholesterol.

## 2023-05-11 ENCOUNTER — Encounter: Payer: BC Managed Care – PPO | Admitting: Family Medicine
